# Patient Record
Sex: Male | Born: 2000 | Race: Black or African American | Hispanic: No | Marital: Single | Smoking: Never smoker
Health system: Southern US, Community
[De-identification: ages and names within clinical notes are randomized; demographics above are authoritative.]

## PROBLEM LIST (undated history)

## (undated) DIAGNOSIS — D571 Sickle-cell disease without crisis: Secondary | ICD-10-CM

---
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
## 2023-02-11 ENCOUNTER — Encounter: Payer: Self-pay | Admitting: Emergency Medicine

## 2023-02-11 ENCOUNTER — Emergency Department
Admission: EM | Admit: 2023-02-11 | Discharge: 2023-02-11 | Disposition: A | Discharge status: Home / Self Care | Payer: BC Managed Care – PPO | Attending: Emergency Medicine | Admitting: Emergency Medicine

## 2023-02-11 ENCOUNTER — Emergency Department: Payer: BC Managed Care – PPO

## 2023-02-11 ENCOUNTER — Other Ambulatory Visit: Payer: Self-pay

## 2023-02-11 DIAGNOSIS — X500XXA Overexertion from strenuous movement or load, initial encounter: Secondary | ICD-10-CM | POA: Insufficient documentation

## 2023-02-11 DIAGNOSIS — S46911A Strain of unspecified muscle, fascia and tendon at shoulder and upper arm level, right arm, initial encounter: Secondary | ICD-10-CM | POA: Diagnosis not present

## 2023-02-11 DIAGNOSIS — S4991XA Unspecified injury of right shoulder and upper arm, initial encounter: Secondary | ICD-10-CM | POA: Diagnosis present

## 2023-02-11 LAB — COMPREHENSIVE METABOLIC PANEL
ALT: 37 U/L (ref 0–44)
AST: 25 U/L (ref 15–41)
Albumin: 5 g/dL (ref 3.5–5.0)
Alkaline Phosphatase: 75 U/L (ref 38–126)
Anion gap: 12 (ref 5–15)
BUN: 11 mg/dL (ref 6–20)
CO2: 22 mmol/L (ref 22–32)
Calcium: 9.5 mg/dL (ref 8.9–10.3)
Chloride: 103 mmol/L (ref 98–111)
Creatinine, Ser: 0.85 mg/dL (ref 0.61–1.24)
GFR, Estimated: >60 mL/min (ref >60)
Glucose, Bld: 92 mg/dL (ref 70–99)
Potassium: 4 mmol/L (ref 3.5–5.1)
Sodium: 137 mmol/L (ref 135–145)
Total Bilirubin: 1.4 mg/dL — ABNORMAL HIGH (ref 0.3–1.2)
Total Protein: 8 g/dL (ref 6.5–8.1)

## 2023-02-11 LAB — CBC WITH DIFFERENTIAL/PLATELET
Abs Immature Granulocytes: 0.04 10*3/uL (ref 0.00–0.07)
Basophils Absolute: 0.1 10*3/uL (ref 0.0–0.1)
Basophils Relative: 1 %
Eosinophils Absolute: 0.1 10*3/uL (ref 0.0–0.5)
Eosinophils Relative: 1 %
HCT: 43.9 % (ref 39.0–52.0)
Hemoglobin: 15.4 g/dL (ref 13.0–17.0)
Immature Granulocytes: 0 %
Lymphocytes Relative: 18 %
Lymphs Abs: 1.8 10*3/uL (ref 0.7–4.0)
MCH: 24.9 pg — ABNORMAL LOW (ref 26.0–34.0)
MCHC: 35.1 g/dL (ref 30.0–36.0)
MCV: 71 fL — ABNORMAL LOW (ref 80.0–100.0)
Monocytes Absolute: 0.6 10*3/uL (ref 0.1–1.0)
Monocytes Relative: 6 %
Neutro Abs: 7.7 10*3/uL (ref 1.7–7.7)
Neutrophils Relative %: 74 %
Platelets: 244 10*3/uL (ref 150–400)
RBC: 6.18 MIL/uL — ABNORMAL HIGH (ref 4.22–5.81)
RDW: 14.7 % (ref 11.5–15.5)
WBC: 10.3 10*3/uL (ref 4.0–10.5)
nRBC: 0 % (ref 0.0–0.2)

## 2023-02-11 LAB — RETICULOCYTES
Immature Retic Fract: 24.8 % — ABNORMAL HIGH (ref 2.3–15.9)
RBC.: 6.09 MIL/uL — ABNORMAL HIGH (ref 4.22–5.81)
Retic Count, Absolute: 174.2 10*3/uL (ref 19.0–186.0)
Retic Ct Pct: 2.9 % (ref 0.4–3.1)

## 2023-02-11 MED ORDER — SODIUM CHLORIDE 0.9 % IV BOLUS
1000.0000 mL | Freq: Once | INTRAVENOUS | Status: AC
  Administered 2023-02-11: 1000 mL via INTRAVENOUS
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
